# Patient Record
Sex: Male | Born: 1995 | Race: Black or African American | Hispanic: No | Marital: Single
Health system: Southern US, Community
[De-identification: ages and names within clinical notes are randomized; demographics above are authoritative.]

---
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
## 2015-11-14 ENCOUNTER — Ambulatory Visit (HOSPITAL_COMMUNITY)
Admission: EM | Admit: 2015-11-14 | Discharge: 2015-11-14 | Disposition: A | Discharge status: Home / Self Care | Payer: Medicaid Other | Attending: Family Medicine | Admitting: Family Medicine

## 2015-11-14 ENCOUNTER — Encounter (HOSPITAL_COMMUNITY): Payer: Self-pay | Admitting: Emergency Medicine

## 2015-11-14 DIAGNOSIS — N342 Other urethritis: Secondary | ICD-10-CM

## 2015-11-14 DIAGNOSIS — Z711 Person with feared health complaint in whom no diagnosis is made: Secondary | ICD-10-CM

## 2015-11-14 DIAGNOSIS — R300 Dysuria: Secondary | ICD-10-CM | POA: Diagnosis present

## 2015-11-14 MED ORDER — LIDOCAINE HCL (PF) 1 % IJ SOLN
INTRAMUSCULAR | Status: AC
  Filled 2015-11-14: qty 2

## 2015-11-14 MED ORDER — CEFTRIAXONE SODIUM 250 MG IJ SOLR
INTRAMUSCULAR | Status: AC
  Filled 2015-11-14: qty 250

## 2015-11-14 MED ORDER — AZITHROMYCIN 250 MG PO TABS
ORAL_TABLET | ORAL | Status: AC
  Filled 2015-11-14: qty 1

## 2015-11-14 MED ORDER — CEFTRIAXONE SODIUM 250 MG IJ SOLR
250.0000 mg | Freq: Once | INTRAMUSCULAR | Status: AC
  Administered 2015-11-14: 250 mg via INTRAMUSCULAR

## 2015-11-14 MED ORDER — AZITHROMYCIN 250 MG PO TABS
ORAL_TABLET | ORAL | Status: AC
  Filled 2015-11-14: qty 4

## 2015-11-14 MED ORDER — AZITHROMYCIN 250 MG PO TABS
1000.0000 mg | ORAL_TABLET | Freq: Once | ORAL | Status: AC
  Administered 2015-11-14: 1000 mg via ORAL

## 2015-11-14 NOTE — ED Provider Notes (Signed)
CSN: [id number]     Arrival date & time 11/14/15  1007 History   First MD Initiated Contact with Patient 11/14/15 1035     Chief Complaint  Patient presents with  . Dysuria   (Consider location/radiation/quality/duration/timing/severity/associated sxs/prior Treatment) HPI 20 year old male presents with dysuria and a white green discharge from his penis. He states that he was sexually active about a week and a half ago with a new partner. He states that he has a great deal of burning with urination. No other symptoms no previous history of STD. History reviewed. No pertinent past medical history. History reviewed. No pertinent surgical history. History reviewed. No pertinent family history. Social History  Substance Use Topics  . Smoking status: Never Smoker  . Smokeless tobacco: Never Used  . Alcohol use No    Review of Systems  Denies: HEADACHE, NAUSEA, ABDOMINAL PAIN, CHEST PAIN, CONGESTION, SHORTNESS OF BREATH  Allergies  Shellfish-derived products  Home Medications   Prior to Admission medications   Medication Sig Start Date End Date Taking? Authorizing Provider  cephALEXin (KEFLEX) 500 MG capsule Take 1 capsule (500 mg total) by mouth 2 (two) times daily. 11/10/12   Adlih Moreno-Coll, MD  hydrOXYzine (ATARAX/VISTARIL) 25 MG tablet Take 1 tablet (25 mg total) by mouth every 8 (eight) hours as needed for itching ((atarax)). 11/10/12   Adlih Moreno-Coll, MD  ketoconazole (NIZORAL) 2 % cream Apply topically 2 (two) times daily. 11/10/12   Sharin Grave, MD   Meds Ordered and Administered this Visit   Medications  cefTRIAXone (ROCEPHIN) injection 250 mg (250 mg Intramuscular Given 11/14/15 1107)  azithromycin (ZITHROMAX) tablet 1,000 mg (1,000 mg Oral Given 11/14/15 1104)    BP 133/79 (BP Location: Left Arm)   Pulse 63   Temp 98.2 F (36.8 C) (Oral)   Resp 16   SpO2 96%  No data found.   Physical Exam NURSES NOTES AND VITAL SIGNS REVIEWED. CONSTITUTIONAL: Well  developed, well nourished, no acute distress HEENT: normocephalic, atraumatic EYES: Conjunctiva normal NECK:normal ROM, supple, no adenopathy PULMONARY:No respiratory distress, normal effort ABDOMINAL: Soft, ND, NT BS+, No CVAT MUSCULOSKELETAL: Normal ROM of all extremities,  SKIN: warm and dry without rash PSYCHIATRIC: Mood and affect, behavior are normal  Urgent Care Course   Clinical Course    Procedures (including critical care time)  Labs Review Labs Reviewed  HIV ANTIBODY (ROUTINE TESTING)  URINE CYTOLOGY ANCILLARY ONLY   Cultures are pending along with HIV testing. Imaging Review No results found.   Visual Acuity Review  Right Eye Distance:   Left Eye Distance:   Bilateral Distance:    Right Eye Near:   Left Eye Near:    Bilateral Near:        Total Visit Time:20 MINUTES "GREATER THAN 50% WAS SPENT IN COUNSELING AND COORDINATION OF CARE WITH THE PATIENT" DISCUSSION OF SAFE SEX, STD TREATMENT WAS PROVIDED.   MDM   1. Urethritis   2. Concern about STD in male without diagnosis     Patient is reassured that there are no issues that require transfer to higher level of care at this time or additional tests. Patient is advised to continue home symptomatic treatment. Patient is advised that if there are new or worsening symptoms to attend the emergency department, contact primary care provider, or return to UC. Instructions of care provided discharged home in stable condition.    THIS NOTE WAS GENERATED USING A VOICE RECOGNITION SOFTWARE PROGRAM. ALL REASONABLE EFFORTS  WERE MADE TO PROOFREAD THIS DOCUMENT  FOR ACCURACY.  I have verbally reviewed the discharge instructions with the patient. A printed AVS was given to the patient.  All questions were answered prior to discharge.      Tharon Aquas, PA 11/14/15 2102

## 2015-11-14 NOTE — ED Triage Notes (Signed)
The patient presented to the [facility] with a complaint of dysuria and a penile discharge x 4 days.

## 2015-11-15 LAB — URINE CYTOLOGY ANCILLARY ONLY
Chlamydia: POSITIVE — AB
NEISSERIA GONORRHEA: POSITIVE — AB

## 2015-11-15 LAB — HIV ANTIBODY (ROUTINE TESTING W REFLEX): HIV Screen 4th Generation wRfx: NONREACTIVE

## 2015-11-20 ENCOUNTER — Telehealth (HOSPITAL_COMMUNITY): Payer: Self-pay | Admitting: *Deleted

## 2017-03-18 ENCOUNTER — Emergency Department (HOSPITAL_COMMUNITY)
Admission: EM | Admit: 2017-03-18 | Discharge: 2017-03-18 | Disposition: A | Discharge status: Home / Self Care | Payer: Medicaid Other | Attending: Emergency Medicine | Admitting: Emergency Medicine

## 2017-03-18 ENCOUNTER — Other Ambulatory Visit: Payer: Self-pay

## 2017-03-18 ENCOUNTER — Encounter (HOSPITAL_COMMUNITY): Payer: Self-pay | Admitting: Emergency Medicine

## 2017-03-18 DIAGNOSIS — Z202 Contact with and (suspected) exposure to infections with a predominantly sexual mode of transmission: Secondary | ICD-10-CM | POA: Diagnosis not present

## 2017-03-18 DIAGNOSIS — Z711 Person with feared health complaint in whom no diagnosis is made: Secondary | ICD-10-CM

## 2017-03-18 LAB — URINALYSIS, ROUTINE W REFLEX MICROSCOPIC
Bilirubin Urine: NEGATIVE
GLUCOSE, UA: NEGATIVE mg/dL
Hgb urine dipstick: NEGATIVE
Ketones, ur: NEGATIVE mg/dL
LEUKOCYTES UA: NEGATIVE
NITRITE: NEGATIVE
PROTEIN: NEGATIVE mg/dL
Specific Gravity, Urine: 1.018 (ref 1.005–1.030)
pH: 6 (ref 5.0–8.0)

## 2017-03-18 MED ORDER — AZITHROMYCIN 250 MG PO TABS
1000.0000 mg | ORAL_TABLET | Freq: Once | ORAL | Status: AC
  Administered 2017-03-18: 1000 mg via ORAL
  Filled 2017-03-18: qty 4

## 2017-03-18 MED ORDER — LIDOCAINE HCL (PF) 1 % IJ SOLN
INTRAMUSCULAR | Status: AC
  Administered 2017-03-18: 0.9 mL
  Filled 2017-03-18: qty 5

## 2017-03-18 MED ORDER — CEFTRIAXONE SODIUM 250 MG IJ SOLR
250.0000 mg | Freq: Once | INTRAMUSCULAR | Status: AC
  Administered 2017-03-18: 250 mg via INTRAMUSCULAR
  Filled 2017-03-18: qty 250

## 2017-03-18 NOTE — Discharge Instructions (Signed)
Follow-up with results of remaining lab work when available. Return to ED for worsening symptoms, penile discharge, rashes, lesions or penile tenderness.

## 2017-03-18 NOTE — ED Triage Notes (Signed)
Pt reports gf has chlamydia, wants to be checked for same, denies any symptoms.

## 2017-03-18 NOTE — ED Provider Notes (Signed)
[facility] Provider Note   CSN: [id number] Arrival date & time: 03/18/17  1155     History   Chief Complaint Chief Complaint  Patient presents with  . Exposure to STD    HPI David David Bradford is a 21 y.o. male who presents to ED for evaluation of exposure to STD.  He states that his girlfriend tested positive for chlamydia and he is here to get checked.  He does report mild dysuria but no penile discharge, rashes, lesions, abdominal pain, bowel changes.  He did have unprotected sexual intercourse with her yesterday.  HPI  History reviewed. No pertinent past medical history.  There are no active problems to display for this patient.   History reviewed. No pertinent surgical history.     Home Medications    Prior to Admission medications   Medication Sig Start Date End Date Taking? Authorizing Provider  cephALEXin (KEFLEX) 500 MG capsule Take 1 capsule (500 mg total) by mouth 2 (two) times daily. 11/10/12   Moreno-Coll, Adlih, MD  hydrOXYzine (ATARAX/VISTARIL) 25 MG tablet Take 1 tablet (25 mg total) by mouth every 8 (eight) hours as needed for itching ((atarax)). 11/10/12   Moreno-Coll, Adlih, MD  ketoconazole (NIZORAL) 2 % cream Apply topically 2 (two) times daily. 11/10/12   Moreno-Coll, Adlih, MD    Family History No family history on file.  Social History Social History   Tobacco Use  . Smoking status: Never Smoker  . Smokeless tobacco: Never Used  Substance Use Topics  . Alcohol use: No  . Drug use: No     Allergies   Shellfish-derived products   Review of Systems Review of Systems  Constitutional: Negative for chills and fever.  Gastrointestinal: Negative for abdominal pain, constipation, diarrhea, nausea and vomiting.  Genitourinary: Positive for dysuria. Negative for discharge, genital sores, penile pain, penile swelling, scrotal swelling and testicular pain.  Skin: Negative for rash.     Physical Exam Updated  Vital Signs BP (!) 147/70   Pulse (!) 56   Temp 98.8 F (37.1 C) (Oral)   Resp 12   SpO2 100%   Physical Exam  Constitutional: He appears well-developed and well-nourished. No distress.  HENT:  Head: Normocephalic and atraumatic.  Eyes: Conjunctivae and EOM are normal. No scleral icterus.  Neck: Normal range of motion.  Pulmonary/Chest: Effort normal. No respiratory distress.  Genitourinary: Right testis shows no tenderness. Left testis shows no tenderness. Circumcised. No penile tenderness. No discharge found.  Genitourinary Comments: Normal male genitalia noted. Penis, scrotum, and testicles without swelling, lesions, rashes, or tenderness present. No penile discharge noted. Cremasteric reflex intact. RN served as Biomedical engineerchaperone during the exam.   Neurological: He is alert.  Skin: No rash noted. He is not diaphoretic.  Psychiatric: He has a normal mood and affect.  Nursing note and vitals reviewed.    ED Treatments / Results  Labs (all labs ordered are listed, but only abnormal results are displayed) Labs Reviewed  URINALYSIS, ROUTINE W REFLEX MICROSCOPIC  HIV ANTIBODY (ROUTINE TESTING)  RPR  GC/CHLAMYDIA PROBE AMP ([place]    EKG  EKG Interpretation None       Radiology No results found.  Procedures Procedures (including critical care time)  Medications Ordered in ED Medications  cefTRIAXone (ROCEPHIN) injection 250 mg (250 mg Intramuscular Given 03/18/17 1435)  azithromycin (ZITHROMAX) tablet 1,000 mg (1,000 mg Oral Given 03/18/17 1435)  lidocaine (PF) (XYLOCAINE) 1 % injection (0.9 mLs  Given  03/18/17 1436)     Initial Impression / Assessment and Plan / ED Course  I have reviewed the triage vital signs and the nursing notes.  Pertinent labs & imaging results that were available during my care of the patient were reviewed by me and considered in my medical decision making (see chart for details).     Patient presents to ED for STD exposure.   States that his girlfriend was diagnosed with chlamydia recently.  Last unprotected sexual intercourse was last night.  No rashes, lesions or bumps noted on physical exam.  He is afebrile with no abdominal pain, urinary symptoms or bowel changes.  He is nontoxic-appearing and in no acute distress.  We will treat empirically for gonorrhea and chlamydia and advised patient to follow-up with the results of remaining lab work when available.  Patient appears stable for discharge at this time.  Strict return precautions given.  Final Clinical Impressions(s) / ED Diagnoses   Final diagnoses:  Concern about STD in male without diagnosis    ED Discharge Orders    None       David PatesKhatri, David David Bradford, David Bradford 03/18/17 1441    David David Bradford, [name], David David Bradford 03/19/17 1603

## 2017-03-19 LAB — HIV ANTIBODY (ROUTINE TESTING W REFLEX): HIV SCREEN 4TH GENERATION: NONREACTIVE

## 2017-03-19 LAB — RPR: RPR Ser Ql: NONREACTIVE

## 2017-03-19 LAB — GC/CHLAMYDIA PROBE AMP (~~LOC~~) NOT AT ARMC
Chlamydia: POSITIVE — AB
NEISSERIA GONORRHEA: NEGATIVE

## 2021-05-20 ENCOUNTER — Other Ambulatory Visit: Payer: Self-pay

## 2021-05-20 ENCOUNTER — Ambulatory Visit (HOSPITAL_COMMUNITY)
Admission: EM | Admit: 2021-05-20 | Discharge: 2021-05-20 | Disposition: A | Discharge status: Home / Self Care | Payer: Self-pay | Attending: Urgent Care | Admitting: Urgent Care

## 2021-05-20 ENCOUNTER — Encounter (HOSPITAL_COMMUNITY): Payer: Self-pay

## 2021-05-20 DIAGNOSIS — Z7251 High risk heterosexual behavior: Secondary | ICD-10-CM | POA: Insufficient documentation

## 2021-05-20 DIAGNOSIS — R369 Urethral discharge, unspecified: Secondary | ICD-10-CM | POA: Insufficient documentation

## 2021-05-20 DIAGNOSIS — R300 Dysuria: Secondary | ICD-10-CM | POA: Insufficient documentation

## 2021-05-20 MED ORDER — CEFTRIAXONE SODIUM 1 G IJ SOLR: 1.0000 g | Freq: Once | INTRAMUSCULAR | Status: DC

## 2021-05-20 MED ORDER — CEFTRIAXONE SODIUM 500 MG IJ SOLR
500.0000 mg | Freq: Once | INTRAMUSCULAR | Status: AC
  Administered 2021-05-20: 500 mg via INTRAMUSCULAR

## 2021-05-20 MED ORDER — CEFTRIAXONE SODIUM 500 MG IJ SOLR
INTRAMUSCULAR | Status: AC
  Filled 2021-05-20: qty 500

## 2021-05-20 MED ORDER — LIDOCAINE HCL (PF) 1 % IJ SOLN
INTRAMUSCULAR | Status: AC
  Filled 2021-05-20: qty 2

## 2021-05-20 NOTE — ED Triage Notes (Signed)
Pt presents with c/o dysuria X 4 days.   Pt reports for STD testing.

## 2021-05-20 NOTE — ED Provider Notes (Signed)
[facility]   MRN: [id number] DOB: 1995-09-30  Subjective:   David Bradford is a 26 y.o. male presenting for 4 day history of acute onset dysuria. He is sexually active, generally uses condoms but recently in the past week he had 75 male partner that he chose not to use condoms with.  He is unaware of any particular infections that she may have.  Denies hematuria, urinary frequency, penile discharge, penile swelling, testicular pain, testicular swelling, anal pain, groin pain.  No concern for oral or anal infections.    No current facility-administered medications for this encounter.  Current Outpatient Medications:    cephALEXin (KEFLEX) 500 MG capsule, Take 1 capsule (500 mg total) by mouth 2 (two) times daily., Disp: 14 capsule, Rfl: 0   hydrOXYzine (ATARAX/VISTARIL) 25 MG tablet, Take 1 tablet (25 mg total) by mouth every 8 (eight) hours as needed for itching ((atarax))., Disp: 21 tablet, Rfl: 0   ketoconazole (NIZORAL) 2 % cream, Apply topically 2 (two) times daily., Disp: 60 g, Rfl: 0   Allergies  Allergen Reactions   Shellfish-Derived Products Anaphylaxis    History reviewed. No pertinent past medical history.   History reviewed. No pertinent surgical history.  History reviewed. No pertinent family history.  Social History   Tobacco Use   Smoking status: Every Day    Packs/day: 1.00    Types: Cigarettes   Smokeless tobacco: Never  Vaping Use   Vaping Use: Never used  Substance Use Topics   Alcohol use: Yes    Comment: everyday   Drug use: Yes    Frequency: 2.0 times per week    Types: Marijuana    ROS   Objective:   Vitals: BP 127/80    Pulse 70    Temp 98.3 F (36.8 C) (Oral)    Resp 17    SpO2 99%   Physical Exam Constitutional:      General: He is not in acute distress.    Appearance: Normal appearance. He is well-developed and normal weight. He is not ill-appearing, toxic-appearing or diaphoretic.  HENT:     Head: Normocephalic  and atraumatic.     Right Ear: External ear normal.     Left Ear: External ear normal.     Nose: Nose normal.     Mouth/Throat:     Pharynx: Oropharynx is clear.  Eyes:     General: No scleral icterus.       Right eye: No discharge.        Left eye: No discharge.     Extraocular Movements: Extraocular movements intact.  Cardiovascular:     Rate and Rhythm: Normal rate.  Pulmonary:     Effort: Pulmonary effort is normal.  Genitourinary:    Penis: Discharge (white, thick) present. No phimosis, paraphimosis, hypospadias, erythema, tenderness, swelling or lesions.   Musculoskeletal:     Cervical back: Normal range of motion.  Neurological:     Mental Status: He is alert and oriented to person, place, and time.  Psychiatric:        Mood and Affect: Mood normal.        Behavior: Behavior normal.        Thought Content: Thought content normal.        Judgment: Judgment normal.    Assessment and Plan :   PDMP not reviewed this encounter.  1. Penile discharge   2. Dysuria   3. Unprotected sex     Patient has frank  penile discharge on exam and therefore we will treat empirically with IM ceftriaxone.  This is in line with CDC guidelines.  However, patient declined a prescription for doxycycline due to financial burden.  We will treat based off of results.  Patient refused HIV and syphilis testing. Counseled patient on potential for adverse effects with medications prescribed/recommended today, ER and return-to-clinic precautions discussed, patient verbalized understanding.    Jaynee Eagles, [place] 05/20/21 1628

## 2021-05-20 NOTE — Discharge Instructions (Addendum)
Avoid all forms of sexual intercourse (oral, vaginal, anal) for the next 7 days to avoid spreading/reinfecting or at least until we can see what kinds of infection results are positive. Return if symptoms worsen/do not resolve, you develop fever, abdominal pain, blood in your urine, or are re-exposed to a sexually transmitted infection (STI).  

## 2021-05-21 LAB — CYTOLOGY, (ORAL, ANAL, URETHRAL) ANCILLARY ONLY
Chlamydia: NEGATIVE
Comment: NEGATIVE
Comment: NEGATIVE
Comment: NORMAL
Neisseria Gonorrhea: POSITIVE — AB
Trichomonas: POSITIVE — AB

## 2021-05-22 ENCOUNTER — Telehealth (HOSPITAL_COMMUNITY): Payer: Self-pay | Admitting: Emergency Medicine

## 2021-05-22 MED ORDER — METRONIDAZOLE 500 MG PO TABS: 2000.0000 mg | ORAL_TABLET | Freq: Once | ORAL | 0 refills | Status: AC

## 2022-01-04 ENCOUNTER — Other Ambulatory Visit: Payer: Self-pay

## 2022-01-04 ENCOUNTER — Encounter (HOSPITAL_COMMUNITY): Payer: Self-pay | Admitting: *Deleted

## 2022-01-04 ENCOUNTER — Ambulatory Visit (HOSPITAL_COMMUNITY)
Admission: EM | Admit: 2022-01-04 | Discharge: 2022-01-04 | Disposition: A | Discharge status: Home / Self Care | Payer: Medicaid Other | Attending: Family Medicine | Admitting: Family Medicine

## 2022-01-04 DIAGNOSIS — R369 Urethral discharge, unspecified: Secondary | ICD-10-CM

## 2022-01-04 DIAGNOSIS — Z202 Contact with and (suspected) exposure to infections with a predominantly sexual mode of transmission: Secondary | ICD-10-CM

## 2022-01-04 MED ORDER — METRONIDAZOLE 500 MG PO TABS
2000.0000 mg | ORAL_TABLET | Freq: Once | ORAL | Status: AC
  Administered 2022-01-04: 2000 mg via ORAL

## 2022-01-04 MED ORDER — DOXYCYCLINE HYCLATE 100 MG PO CAPS: 100.0000 mg | ORAL_CAPSULE | Freq: Two times a day (BID) | ORAL | 0 refills | Status: DC

## 2022-01-04 MED ORDER — CEFTRIAXONE SODIUM 500 MG IJ SOLR
INTRAMUSCULAR | Status: AC
  Filled 2022-01-04: qty 500

## 2022-01-04 MED ORDER — METRONIDAZOLE 500 MG PO TABS
ORAL_TABLET | ORAL | Status: AC
  Filled 2022-01-04: qty 4

## 2022-01-04 MED ORDER — CEFTRIAXONE SODIUM 500 MG IJ SOLR
500.0000 mg | Freq: Once | INTRAMUSCULAR | Status: AC
  Administered 2022-01-04: 500 mg via INTRAMUSCULAR

## 2022-01-04 NOTE — ED Triage Notes (Signed)
Pt reports he was notified by other partner they tested Positive for STD. Sx;s dysura ,and discharge from penis.

## 2022-01-04 NOTE — Discharge Instructions (Signed)
You have been given the following today for treatment of suspected gonorrhea and/or chlamydia and/or trich:  cefTRIAXone (ROCEPHIN) injection 500 mg Metronidazole 2000mg  by mouth  Please pick up your prescription for doxycycline 100 mg and begin taking twice daily for the next seven (7) days.  Even though we have treated you today, we have sent testing for sexually transmitted infections. We will notify you of any positive results once they are received. If required, we will prescribe any medications you might need.  Please refrain from all sexual activity for at least the next seven days.
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
## 2022-01-11 ENCOUNTER — Encounter (HOSPITAL_COMMUNITY): Admission: EM | Disposition: A | Discharge status: Home / Self Care | Payer: Self-pay | Source: Home / Self Care

## 2022-01-11 ENCOUNTER — Emergency Department (HOSPITAL_COMMUNITY): Payer: Self-pay | Admitting: Anesthesiology

## 2022-01-11 ENCOUNTER — Inpatient Hospital Stay (HOSPITAL_COMMUNITY)
Admission: EM | Admit: 2022-01-11 | Discharge: 2022-01-17 | DRG: 328 | Disposition: A | Discharge status: Home / Self Care | Payer: Self-pay | Attending: General Surgery | Admitting: General Surgery

## 2022-01-11 ENCOUNTER — Emergency Department (HOSPITAL_COMMUNITY): Payer: Self-pay

## 2022-01-11 ENCOUNTER — Encounter (HOSPITAL_COMMUNITY): Payer: Self-pay | Admitting: Emergency Medicine

## 2022-01-11 DIAGNOSIS — W3400XA Accidental discharge from unspecified firearms or gun, initial encounter: Principal | ICD-10-CM

## 2022-01-11 DIAGNOSIS — Z9889 Other specified postprocedural states: Secondary | ICD-10-CM

## 2022-01-11 DIAGNOSIS — Z91013 Allergy to seafood: Secondary | ICD-10-CM

## 2022-01-11 DIAGNOSIS — S36521A Contusion of transverse colon, initial encounter: Secondary | ICD-10-CM | POA: Diagnosis present

## 2022-01-11 DIAGNOSIS — E876 Hypokalemia: Secondary | ICD-10-CM | POA: Diagnosis present

## 2022-01-11 DIAGNOSIS — S3632XA Contusion of stomach, initial encounter: Secondary | ICD-10-CM | POA: Diagnosis present

## 2022-01-11 DIAGNOSIS — Z5331 Laparoscopic surgical procedure converted to open procedure: Secondary | ICD-10-CM

## 2022-01-11 DIAGNOSIS — Z23 Encounter for immunization: Secondary | ICD-10-CM

## 2022-01-11 DIAGNOSIS — S31631A Puncture wound without foreign body of abdominal wall, left upper quadrant with penetration into peritoneal cavity, initial encounter: Principal | ICD-10-CM | POA: Diagnosis present

## 2022-01-11 DIAGNOSIS — S31139A Puncture wound of abdominal wall without foreign body, unspecified quadrant without penetration into peritoneal cavity, initial encounter: Secondary | ICD-10-CM

## 2022-01-11 DIAGNOSIS — Z1152 Encounter for screening for COVID-19: Secondary | ICD-10-CM

## 2022-01-11 HISTORY — PX: LAPAROSCOPY: SHX197

## 2022-01-11 HISTORY — PX: LAPAROTOMY: SHX154

## 2022-01-11 HISTORY — PX: GASTRORRHAPHY: SHX6263

## 2022-01-11 LAB — COMPREHENSIVE METABOLIC PANEL
ALT: 19 U/L (ref 0–44)
AST: 29 U/L (ref 15–41)
Albumin: 4.4 g/dL (ref 3.5–5.0)
Alkaline Phosphatase: 62 U/L (ref 38–126)
Anion gap: 20 — ABNORMAL HIGH (ref 5–15)
BUN: 10 mg/dL (ref 6–20)
CO2: 15 mmol/L — ABNORMAL LOW (ref 22–32)
Calcium: 9.2 mg/dL (ref 8.9–10.3)
Chloride: 104 mmol/L (ref 98–111)
Creatinine, Ser: 1.43 mg/dL — ABNORMAL HIGH (ref 0.61–1.24)
GFR, Estimated: >60 mL/min (ref >60)
Glucose, Bld: 110 mg/dL — ABNORMAL HIGH (ref 70–99)
Potassium: 3.3 mmol/L — ABNORMAL LOW (ref 3.5–5.1)
Sodium: 139 mmol/L (ref 135–145)
Total Bilirubin: 0.5 mg/dL (ref 0.3–1.2)
Total Protein: 7.3 g/dL (ref 6.5–8.1)

## 2022-01-11 LAB — SAMPLE TO BLOOD BANK

## 2022-01-11 LAB — CBC
HCT: 41.2 % (ref 39.0–52.0)
Hemoglobin: 13.6 g/dL (ref 13.0–17.0)
MCH: 31.2 pg (ref 26.0–34.0)
MCHC: 33 g/dL (ref 30.0–36.0)
MCV: 94.5 fL (ref 80.0–100.0)
Platelets: 254 10*3/uL (ref 150–400)
RBC: 4.36 MIL/uL (ref 4.22–5.81)
RDW: 12.1 % (ref 11.5–15.5)
WBC: 4.9 10*3/uL (ref 4.0–10.5)
nRBC: 0 % (ref 0.0–0.2)

## 2022-01-11 LAB — I-STAT CHEM 8, ED
BUN: 9 mg/dL (ref 6–20)
Calcium, Ion: 1.02 mmol/L — ABNORMAL LOW (ref 1.15–1.40)
Chloride: 105 mmol/L (ref 98–111)
Creatinine, Ser: 1.5 mg/dL — ABNORMAL HIGH (ref 0.61–1.24)
Glucose, Bld: 106 mg/dL — ABNORMAL HIGH (ref 70–99)
HCT: 43 % (ref 39.0–52.0)
Hemoglobin: 14.6 g/dL (ref 13.0–17.0)
Potassium: 3.3 mmol/L — ABNORMAL LOW (ref 3.5–5.1)
Sodium: 140 mmol/L (ref 135–145)
TCO2: 15 mmol/L — ABNORMAL LOW (ref 22–32)

## 2022-01-11 LAB — ETHANOL: Alcohol, Ethyl (B): 140 mg/dL — ABNORMAL HIGH (ref <10)

## 2022-01-11 LAB — PROTIME-INR
INR: 1.1 (ref 0.8–1.2)
Prothrombin Time: 14.4 seconds (ref 11.4–15.2)

## 2022-01-11 LAB — LACTIC ACID, PLASMA: Lactic Acid, Venous: >9 mmol/L (ref 0.5–1.9)

## 2022-01-11 LAB — RESP PANEL BY RT-PCR (FLU A&B, COVID) ARPGX2
Influenza A by PCR: NEGATIVE
Influenza B by PCR: NEGATIVE
SARS Coronavirus 2 by RT PCR: NEGATIVE

## 2022-01-11 SURGERY — LAPAROTOMY, EXPLORATORY
Anesthesia: General | Site: Abdomen

## 2022-01-11 MED ORDER — SUGAMMADEX SODIUM 200 MG/2ML IV SOLN
INTRAVENOUS | Status: DC | PRN
  Administered 2022-01-11 (×3): 100 mg via INTRAVENOUS

## 2022-01-11 MED ORDER — HYDROMORPHONE HCL 1 MG/ML IJ SOLN
INTRAMUSCULAR | Status: AC
  Filled 2022-01-11: qty 1

## 2022-01-11 MED ORDER — FENTANYL CITRATE (PF) 250 MCG/5ML IJ SOLN
INTRAMUSCULAR | Status: AC
  Filled 2022-01-11: qty 5

## 2022-01-11 MED ORDER — PROPOFOL 1000 MG/100ML IV EMUL
INTRAVENOUS | Status: AC
  Filled 2022-01-11: qty 100

## 2022-01-11 MED ORDER — BUPIVACAINE HCL 0.25 % IJ SOLN
INTRAMUSCULAR | Status: DC | PRN
  Administered 2022-01-11: 5 mL

## 2022-01-11 MED ORDER — ROCURONIUM BROMIDE 10 MG/ML (PF) SYRINGE
PREFILLED_SYRINGE | INTRAVENOUS | Status: AC
  Filled 2022-01-11: qty 10

## 2022-01-11 MED ORDER — FENTANYL CITRATE PF 50 MCG/ML IJ SOSY
PREFILLED_SYRINGE | INTRAMUSCULAR | Status: AC
  Administered 2022-01-11: 50 ug
  Filled 2022-01-11: qty 1

## 2022-01-11 MED ORDER — BUPIVACAINE-EPINEPHRINE (PF) 0.25% -1:200000 IJ SOLN
INTRAMUSCULAR | Status: AC
  Filled 2022-01-11: qty 30

## 2022-01-11 MED ORDER — SUCCINYLCHOLINE CHLORIDE 200 MG/10ML IV SOSY
PREFILLED_SYRINGE | INTRAVENOUS | Status: DC | PRN
  Administered 2022-01-11: 120 mg via INTRAVENOUS

## 2022-01-11 MED ORDER — METRONIDAZOLE 500 MG/100ML IV SOLN
500.0000 mg | INTRAVENOUS | Status: AC
  Administered 2022-01-11: 500 mg via INTRAVENOUS
  Filled 2022-01-11: qty 100

## 2022-01-11 MED ORDER — ONDANSETRON HCL 4 MG/2ML IJ SOLN
INTRAMUSCULAR | Status: AC
  Filled 2022-01-11: qty 2

## 2022-01-11 MED ORDER — LACTATED RINGERS IV SOLN: INTRAVENOUS | Status: DC | PRN

## 2022-01-11 MED ORDER — PHENYLEPHRINE 80 MCG/ML (10ML) SYRINGE FOR IV PUSH (FOR BLOOD PRESSURE SUPPORT)
PREFILLED_SYRINGE | INTRAVENOUS | Status: AC
  Filled 2022-01-11: qty 10

## 2022-01-11 MED ORDER — LIDOCAINE 2% (20 MG/ML) 5 ML SYRINGE
INTRAMUSCULAR | Status: DC | PRN
  Administered 2022-01-11: 40 mg via INTRAVENOUS

## 2022-01-11 MED ORDER — EPHEDRINE 5 MG/ML INJ
INTRAVENOUS | Status: AC
  Filled 2022-01-11: qty 5

## 2022-01-11 MED ORDER — LIDOCAINE 2% (20 MG/ML) 5 ML SYRINGE
INTRAMUSCULAR | Status: AC
  Filled 2022-01-11: qty 5

## 2022-01-11 MED ORDER — ALBUMIN HUMAN 5 % IV SOLN: INTRAVENOUS | Status: DC | PRN

## 2022-01-11 MED ORDER — DEXAMETHASONE SODIUM PHOSPHATE 10 MG/ML IJ SOLN
INTRAMUSCULAR | Status: AC
  Filled 2022-01-11: qty 1

## 2022-01-11 MED ORDER — CEFAZOLIN SODIUM-DEXTROSE 2-4 GM/100ML-% IV SOLN
2.0000 g | Freq: Once | INTRAVENOUS | Status: AC
  Administered 2022-01-11: 2 g via INTRAVENOUS
  Filled 2022-01-11: qty 100

## 2022-01-11 MED ORDER — ROCURONIUM BROMIDE 10 MG/ML (PF) SYRINGE
PREFILLED_SYRINGE | INTRAVENOUS | Status: DC | PRN
  Administered 2022-01-11: 50 mg via INTRAVENOUS

## 2022-01-11 MED ORDER — TETANUS-DIPHTH-ACELL PERTUSSIS 5-2.5-18.5 LF-MCG/0.5 IM SUSY
0.5000 mL | PREFILLED_SYRINGE | Freq: Once | INTRAMUSCULAR | Status: AC
  Administered 2022-01-11: 0.5 mL via INTRAMUSCULAR

## 2022-01-11 MED ORDER — PHENYLEPHRINE 80 MCG/ML (10ML) SYRINGE FOR IV PUSH (FOR BLOOD PRESSURE SUPPORT)
PREFILLED_SYRINGE | INTRAVENOUS | Status: DC | PRN
  Administered 2022-01-11 (×2): 160 ug via INTRAVENOUS

## 2022-01-11 MED ORDER — OXYCODONE HCL 5 MG PO TABS: 5.0000 mg | ORAL_TABLET | Freq: Once | ORAL | Status: DC | PRN

## 2022-01-11 MED ORDER — ONDANSETRON HCL 4 MG/2ML IJ SOLN
INTRAMUSCULAR | Status: DC | PRN
  Administered 2022-01-11: 4 mg via INTRAVENOUS

## 2022-01-11 MED ORDER — SUCCINYLCHOLINE CHLORIDE 200 MG/10ML IV SOSY
PREFILLED_SYRINGE | INTRAVENOUS | Status: AC
  Filled 2022-01-11: qty 10

## 2022-01-11 MED ORDER — EPHEDRINE SULFATE-NACL 50-0.9 MG/10ML-% IV SOSY
PREFILLED_SYRINGE | INTRAVENOUS | Status: DC | PRN
  Administered 2022-01-11 (×2): 10 mg via INTRAVENOUS

## 2022-01-11 MED ORDER — FENTANYL CITRATE (PF) 250 MCG/5ML IJ SOLN
INTRAMUSCULAR | Status: DC | PRN
  Administered 2022-01-11: 100 ug via INTRAVENOUS
  Administered 2022-01-11: 50 ug via INTRAVENOUS

## 2022-01-11 MED ORDER — SODIUM CHLORIDE 0.9 % IV SOLN: INTRAVENOUS | Status: DC | PRN

## 2022-01-11 MED ORDER — ONDANSETRON HCL 4 MG/2ML IJ SOLN: 4.0000 mg | Freq: Four times a day (QID) | INTRAMUSCULAR | Status: DC | PRN

## 2022-01-11 MED ORDER — 0.9 % SODIUM CHLORIDE (POUR BTL) OPTIME
TOPICAL | Status: DC | PRN
  Administered 2022-01-11: 1000 mL

## 2022-01-11 MED ORDER — FENTANYL CITRATE PF 50 MCG/ML IJ SOSY: 50.0000 ug | PREFILLED_SYRINGE | Freq: Once | INTRAMUSCULAR | Status: AC

## 2022-01-11 MED ORDER — MIDAZOLAM HCL 2 MG/2ML IJ SOLN
INTRAMUSCULAR | Status: AC
  Filled 2022-01-11: qty 2

## 2022-01-11 MED ORDER — IOHEXOL 350 MG/ML SOLN
100.0000 mL | Freq: Once | INTRAVENOUS | Status: AC | PRN
  Administered 2022-01-11: 100 mL via INTRAVENOUS

## 2022-01-11 MED ORDER — MIDAZOLAM HCL 2 MG/2ML IJ SOLN
INTRAMUSCULAR | Status: DC | PRN
  Administered 2022-01-11: 2 mg via INTRAVENOUS

## 2022-01-11 MED ORDER — HYDROMORPHONE HCL 1 MG/ML IJ SOLN
0.2500 mg | INTRAMUSCULAR | Status: DC | PRN
  Administered 2022-01-11 – 2022-01-12 (×4): 0.5 mg via INTRAVENOUS

## 2022-01-11 MED ORDER — DEXAMETHASONE SODIUM PHOSPHATE 10 MG/ML IJ SOLN
INTRAMUSCULAR | Status: DC | PRN
  Administered 2022-01-11: 5 mg via INTRAVENOUS

## 2022-01-11 MED ORDER — OXYCODONE HCL 5 MG/5ML PO SOLN: 5.0000 mg | Freq: Once | ORAL | Status: DC | PRN

## 2022-01-11 MED ORDER — PROPOFOL 10 MG/ML IV BOLUS
INTRAVENOUS | Status: AC
  Filled 2022-01-11: qty 20

## 2022-01-11 MED ORDER — PROPOFOL 10 MG/ML IV BOLUS
INTRAVENOUS | Status: DC | PRN
  Administered 2022-01-11: 200 mg via INTRAVENOUS

## 2022-01-11 SURGICAL SUPPLY — 63 items
ADH SKN CLS APL DERMABOND .7 (GAUZE/BANDAGES/DRESSINGS) ×1
APL PRP STRL LF DISP 70% ISPRP (MISCELLANEOUS) ×1
BAG COUNTER SPONGE SURGICOUNT (BAG) ×2 IMPLANT
BAG SPNG CNTER NS LX DISP (BAG) ×1
BLADE CLIPPER SURG (BLADE) IMPLANT
CANISTER SUCT 3000ML PPV (MISCELLANEOUS) IMPLANT
CHLORAPREP W/TINT 26 (MISCELLANEOUS) ×2 IMPLANT
COVER SURGICAL LIGHT HANDLE (MISCELLANEOUS) ×2 IMPLANT
DERMABOND ADVANCED .7 DNX12 (GAUZE/BANDAGES/DRESSINGS) ×2 IMPLANT
DRAPE WARM FLUID 44X44 (DRAPES) ×2 IMPLANT
DRSG TEGADERM 4X10 (GAUZE/BANDAGES/DRESSINGS) IMPLANT
DRSG TEGADERM 4X4.5 CHG (GAUZE/BANDAGES/DRESSINGS) IMPLANT
ELECT REM PT RETURN 9FT ADLT (ELECTROSURGICAL) ×1
ELECTRODE REM PT RTRN 9FT ADLT (ELECTROSURGICAL) ×2 IMPLANT
GAUZE SPONGE 4X4 12PLY STRL (GAUZE/BANDAGES/DRESSINGS) IMPLANT
GLOVE BIO SURGEON STRL SZ7.5 (GLOVE) ×2 IMPLANT
GLOVE BIOGEL M STER SZ 6 (GLOVE) IMPLANT
GLOVE BIOGEL PI IND STRL 7.0 (GLOVE) IMPLANT
GLOVE BIOGEL PI IND STRL 7.5 (GLOVE) IMPLANT
GLOVE BIOGEL PI IND STRL 8 (GLOVE) IMPLANT
GLOVE SURG SYN 7.5  E (GLOVE) ×1
GLOVE SURG SYN 7.5 E (GLOVE) ×1 IMPLANT
GLOVE SURG SYN 7.5 PF PI (GLOVE) ×2 IMPLANT
GOWN STRL REUS W/ TWL LRG LVL3 (GOWN DISPOSABLE) ×4 IMPLANT
GOWN STRL REUS W/ TWL XL LVL3 (GOWN DISPOSABLE) ×2 IMPLANT
GOWN STRL REUS W/TWL LRG LVL3 (GOWN DISPOSABLE) ×2
GOWN STRL REUS W/TWL XL LVL3 (GOWN DISPOSABLE) ×1
KIT BASIN OR (CUSTOM PROCEDURE TRAY) ×2 IMPLANT
KIT TURNOVER KIT B (KITS) ×2 IMPLANT
MASK SURG LG TIE L3 ANTIFOG (MASK) IMPLANT
NDL HYPO 25GX1X1/2 BEV (NEEDLE) IMPLANT
NDL INSUFFLATION 14GA 120MM (NEEDLE) ×2 IMPLANT
NEEDLE HYPO 25GX1X1/2 BEV (NEEDLE) ×1 IMPLANT
NEEDLE INSUFFLATION 14GA 120MM (NEEDLE) ×2 IMPLANT
NS IRRIG 1000ML POUR BTL (IV SOLUTION) ×2 IMPLANT
PAD ARMBOARD 7.5X6 YLW CONV (MISCELLANEOUS) ×4 IMPLANT
RELOAD PROXIMATE 75MM BLUE (ENDOMECHANICALS) ×1 IMPLANT
RELOAD STAPLE 75 3.8 BLU REG (ENDOMECHANICALS) IMPLANT
SCISSORS LAP 5X35 DISP (ENDOMECHANICALS) IMPLANT
SET IRRIG TUBING LAPAROSCOPIC (IRRIGATION / IRRIGATOR) IMPLANT
SET TUBE SMOKE EVAC HIGH FLOW (TUBING) ×2 IMPLANT
SLEEVE ENDOPATH XCEL 5M (ENDOMECHANICALS) ×2 IMPLANT
STAPLER PROXIMATE 75MM BLUE (STAPLE) IMPLANT
STAPLER VISISTAT 35W (STAPLE) IMPLANT
SUT MNCRL AB 4-0 PS2 18 (SUTURE) ×2 IMPLANT
SUT PDS AB 1 CT  36 (SUTURE) ×2
SUT PDS AB 1 CT 36 (SUTURE) IMPLANT
SUT SILK 2 0 SH CR/8 (SUTURE) IMPLANT
SUT SILK 2 0 TIES 10X30 (SUTURE) IMPLANT
SUT SILK 3 0 SH CR/8 (SUTURE) IMPLANT
SUT SILK 3 0 TIES 10X30 (SUTURE) IMPLANT
SYR CONTROL 10ML LL (SYRINGE) IMPLANT
TAPE CLOTH SURG 4X10 WHT LF (GAUZE/BANDAGES/DRESSINGS) IMPLANT
TOWEL GREEN STERILE (TOWEL DISPOSABLE) ×2 IMPLANT
TOWEL GREEN STERILE FF (TOWEL DISPOSABLE) ×2 IMPLANT
TRAY LAPAROSCOPIC MC (CUSTOM PROCEDURE TRAY) ×2 IMPLANT
TROCAR BALLN 12MMX100 BLUNT (TROCAR) IMPLANT
TROCAR XCEL 12X100 BLDLESS (ENDOMECHANICALS) IMPLANT
TROCAR XCEL BLUNT TIP 100MML (ENDOMECHANICALS) IMPLANT
TROCAR XCEL NON-BLD 11X100MML (ENDOMECHANICALS) IMPLANT
TROCAR XCEL NON-BLD 5MMX100MML (ENDOMECHANICALS) IMPLANT
TROCAR Z-THREAD OPTICAL 5X100M (TROCAR) ×2 IMPLANT
WARMER LAPAROSCOPE (MISCELLANEOUS) ×2 IMPLANT

## 2022-01-11 NOTE — Progress Notes (Signed)
Orthopedic Tech Progress Note Patient Details:  Shaheer Bonfield Nov 18, 1995 [id number]  Patient ID: Evaristo Bury, male   DOB: 1995-11-26, 26 y.o.   MRN: [id number] I attended trauma page Karolee Stamps 01/11/2022, 9:39 PM
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
## 2022-01-11 NOTE — Transfer of Care (Signed)
Immediate Anesthesia Transfer of Care Note  Patient: David Bradford  Procedure(s) Performed: LAPAROSCOPY DIAGNOSTIC, POSSIBLE EXPLOR LAP (Abdomen) CONVERTED TO OPEN EXPLORATORY LAPAROTOMY (Abdomen) GASTRORRHAPHY (Abdomen)  Patient Location: PACU  Anesthesia Type:General  Level of Consciousness: drowsy  Airway & Oxygen Therapy: Patient Spontanous Breathing and Patient connected to face mask oxygen  Post-op Assessment: Report given to RN and Post -op Vital signs reviewed and stable  Post vital signs: Reviewed and stable  Last Vitals:  Vitals Value Taken Time  BP 145/88 01/11/22 2315  Temp    Pulse 94 01/11/22 2315  Resp 14 01/11/22 2315  SpO2 96 % 01/11/22 2315  Vitals shown include unvalidated device data.  Last Pain:  Vitals:   01/11/22 2108  TempSrc:   PainSc: 8          Complications: No notable events documented.

## 2022-01-11 NOTE — ED Notes (Signed)
Patient transported to CT scan . 
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
## 2022-01-11 NOTE — Anesthesia Procedure Notes (Signed)
Procedure Name: Intubation Date/Time: 01/11/2022 9:43 PM  Performed by: Trinna Post., CRNAPre-anesthesia Checklist: Patient identified, Emergency Drugs available, Suction available, Patient being monitored and Timeout performed Patient Re-evaluated:Patient Re-evaluated prior to induction Oxygen Delivery Method: Circle system utilized Preoxygenation: Pre-oxygenation with 100% oxygen Induction Type: IV induction, Rapid sequence and Cricoid Pressure applied Laryngoscope Size: Mac and 4 Grade View: Grade I Tube type: Oral Tube size: 7.5 mm Number of attempts: 1 Airway Equipment and Method: Stylet Placement Confirmation: ETT inserted through vocal cords under direct vision, positive ETCO2 and breath sounds checked- equal and bilateral Secured at: 21 cm Tube secured with: Tape Dental Injury: Teeth and Oropharynx as per pre-operative assessment
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
## 2022-01-12 ENCOUNTER — Other Ambulatory Visit: Payer: Self-pay

## 2022-01-12 LAB — CBC
HCT: 38.1 % — ABNORMAL LOW (ref 39.0–52.0)
Hemoglobin: 12.7 g/dL — ABNORMAL LOW (ref 13.0–17.0)
MCH: 30.9 pg (ref 26.0–34.0)
MCHC: 33.3 g/dL (ref 30.0–36.0)
MCV: 92.7 fL (ref 80.0–100.0)
Platelets: 225 10*3/uL (ref 150–400)
RBC: 4.11 MIL/uL — ABNORMAL LOW (ref 4.22–5.81)
RDW: 12.2 % (ref 11.5–15.5)
WBC: 12.1 10*3/uL — ABNORMAL HIGH (ref 4.0–10.5)
nRBC: 0 % (ref 0.0–0.2)

## 2022-01-12 LAB — BASIC METABOLIC PANEL
Anion gap: 13 (ref 5–15)
BUN: 7 mg/dL (ref 6–20)
CO2: 21 mmol/L — ABNORMAL LOW (ref 22–32)
Calcium: 8.7 mg/dL — ABNORMAL LOW (ref 8.9–10.3)
Chloride: 105 mmol/L (ref 98–111)
Creatinine, Ser: 1.12 mg/dL (ref 0.61–1.24)
GFR, Estimated: >60 mL/min (ref >60)
Glucose, Bld: 110 mg/dL — ABNORMAL HIGH (ref 70–99)
Potassium: 3.8 mmol/L (ref 3.5–5.1)
Sodium: 139 mmol/L (ref 135–145)

## 2022-01-12 LAB — HIV ANTIBODY (ROUTINE TESTING W REFLEX): HIV Screen 4th Generation wRfx: NONREACTIVE

## 2022-01-12 MED ORDER — DIPHENHYDRAMINE HCL 50 MG/ML IJ SOLN
12.5000 mg | Freq: Four times a day (QID) | INTRAMUSCULAR | Status: DC | PRN
  Administered 2022-01-14: 12.5 mg via INTRAVENOUS
  Filled 2022-01-12: qty 1

## 2022-01-12 MED ORDER — FLUCONAZOLE IN SODIUM CHLORIDE 200-0.9 MG/100ML-% IV SOLN
200.0000 mg | Freq: Every day | INTRAVENOUS | Status: DC
  Administered 2022-01-12 – 2022-01-15 (×5): 200 mg via INTRAVENOUS
  Filled 2022-01-12 (×6): qty 100

## 2022-01-12 MED ORDER — PIPERACILLIN-TAZOBACTAM 3.375 G IVPB
3.3750 g | Freq: Three times a day (TID) | INTRAVENOUS | Status: DC
  Administered 2022-01-12 – 2022-01-16 (×14): 3.375 g via INTRAVENOUS
  Filled 2022-01-12 (×14): qty 50

## 2022-01-12 MED ORDER — NALOXONE HCL 0.4 MG/ML IJ SOLN: 0.4000 mg | INTRAMUSCULAR | Status: DC | PRN

## 2022-01-12 MED ORDER — PANTOPRAZOLE SODIUM 40 MG IV SOLR
40.0000 mg | Freq: Every day | INTRAVENOUS | Status: DC
  Administered 2022-01-12 – 2022-01-15 (×4): 40 mg via INTRAVENOUS
  Filled 2022-01-12 (×5): qty 10

## 2022-01-12 MED ORDER — METHOCARBAMOL 1000 MG/10ML IJ SOLN
500.0000 mg | Freq: Three times a day (TID) | INTRAVENOUS | Status: DC
  Administered 2022-01-12 – 2022-01-15 (×9): 500 mg via INTRAVENOUS
  Filled 2022-01-12: qty 5
  Filled 2022-01-12 (×4): qty 500
  Filled 2022-01-12: qty 5
  Filled 2022-01-12: qty 500
  Filled 2022-01-12 (×2): qty 5
  Filled 2022-01-12 (×2): qty 500

## 2022-01-12 MED ORDER — DIPHENHYDRAMINE HCL 12.5 MG/5ML PO ELIX: 12.5000 mg | ORAL_SOLUTION | Freq: Four times a day (QID) | ORAL | Status: DC | PRN

## 2022-01-12 MED ORDER — ACETAMINOPHEN 10 MG/ML IV SOLN
1000.0000 mg | Freq: Four times a day (QID) | INTRAVENOUS | Status: AC
  Administered 2022-01-12 – 2022-01-13 (×4): 1000 mg via INTRAVENOUS
  Filled 2022-01-12 (×4): qty 100

## 2022-01-12 MED ORDER — PANTOPRAZOLE SODIUM 40 MG PO TBEC
40.0000 mg | DELAYED_RELEASE_TABLET | Freq: Every day | ORAL | Status: DC
  Administered 2022-01-16 – 2022-01-17 (×2): 40 mg via ORAL
  Filled 2022-01-12 (×2): qty 1

## 2022-01-12 MED ORDER — SODIUM CHLORIDE 0.9% FLUSH: 9.0000 mL | INTRAVENOUS | Status: DC | PRN

## 2022-01-12 MED ORDER — HYDROMORPHONE 1 MG/ML IV SOLN
INTRAVENOUS | Status: DC
  Administered 2022-01-12: 2 mL via INTRAVENOUS
  Administered 2022-01-13: 7 mL via INTRAVENOUS
  Administered 2022-01-13: 2.7 mg via INTRAVENOUS
  Administered 2022-01-13: 6 mL via INTRAVENOUS
  Administered 2022-01-13: 2.7 mg via INTRAVENOUS
  Administered 2022-01-13: 3 mg via INTRAVENOUS
  Administered 2022-01-14: 0.6 mg via INTRAVENOUS
  Administered 2022-01-14: 1.8 mL via INTRAVENOUS
  Filled 2022-01-12 (×3): qty 30

## 2022-01-12 MED ORDER — ONDANSETRON HCL 4 MG/2ML IJ SOLN: 4.0000 mg | Freq: Four times a day (QID) | INTRAMUSCULAR | Status: DC | PRN

## 2022-01-12 MED ORDER — DEXTROSE-NACL 5-0.9 % IV SOLN: INTRAVENOUS | Status: DC

## 2022-01-12 MED ORDER — ONDANSETRON 4 MG PO TBDP: 4.0000 mg | ORAL_TABLET | Freq: Four times a day (QID) | ORAL | Status: DC | PRN

## 2022-01-12 MED ORDER — ENOXAPARIN SODIUM 30 MG/0.3ML IJ SOSY
30.0000 mg | PREFILLED_SYRINGE | Freq: Two times a day (BID) | INTRAMUSCULAR | Status: DC
  Administered 2022-01-12 – 2022-01-16 (×9): 30 mg via SUBCUTANEOUS
  Filled 2022-01-12 (×11): qty 0.3
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
## 2022-01-13 ENCOUNTER — Encounter (HOSPITAL_COMMUNITY): Payer: Self-pay | Admitting: General Surgery

## 2022-01-13 NOTE — Progress Notes (Signed)
Mobility Specialist Progress Note:   01/13/22 1257  Mobility  Activity Ambulated with assistance in hallway  Activity Response Tolerated well  Distance Ambulated (ft) 2000 ft  $Mobility charge 1 Mobility  Level of Assistance Independent after set-up  Assistive Device None   Pt received in bed willing to participate. Complaints of stomach pain. Left in bed with call bell in reach and all needs met.   [facility] Surveyor, mining Chat only
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
## 2022-01-13 NOTE — Anesthesia Postprocedure Evaluation (Signed)
Anesthesia Post Note  Patient: David Bradford  Procedure(s) Performed: LAPAROSCOPY DIAGNOSTIC, POSSIBLE EXPLOR LAP (Abdomen) CONVERTED TO OPEN EXPLORATORY LAPAROTOMY (Abdomen) GASTRORRHAPHY (Abdomen)     Patient location during evaluation: PACU Anesthesia Type: General Level of consciousness: awake and alert Pain management: pain level controlled Vital Signs Assessment: post-procedure vital signs reviewed and stable Respiratory status: spontaneous breathing, nonlabored ventilation, respiratory function stable and patient connected to nasal cannula oxygen Cardiovascular status: blood pressure returned to baseline and stable Postop Assessment: no apparent nausea or vomiting Anesthetic complications: no   No notable events documented.  Last Vitals:  Vitals:   01/13/22 0742 01/13/22 0747  BP:  126/64  Pulse:  67  Resp: 20 17  Temp:  36.8 C  SpO2: 100% 100%    Last Pain:  Vitals:   01/13/22 0747  TempSrc: Oral  PainSc: [place]

## 2022-01-14 LAB — COMPREHENSIVE METABOLIC PANEL
ALT: 14 U/L (ref 0–44)
AST: 27 U/L (ref 15–41)
Albumin: 3.8 g/dL (ref 3.5–5.0)
Alkaline Phosphatase: 40 U/L (ref 38–126)
Anion gap: 8 (ref 5–15)
BUN: 8 mg/dL (ref 6–20)
CO2: 25 mmol/L (ref 22–32)
Calcium: 8.9 mg/dL (ref 8.9–10.3)
Chloride: 106 mmol/L (ref 98–111)
Creatinine, Ser: 1.14 mg/dL (ref 0.61–1.24)
GFR, Estimated: >60 mL/min (ref >60)
Glucose, Bld: 112 mg/dL — ABNORMAL HIGH (ref 70–99)
Potassium: 3.4 mmol/L — ABNORMAL LOW (ref 3.5–5.1)
Sodium: 139 mmol/L (ref 135–145)
Total Bilirubin: 0.7 mg/dL (ref 0.3–1.2)
Total Protein: 6.7 g/dL (ref 6.5–8.1)

## 2022-01-14 LAB — CBC
HCT: 40.5 % (ref 39.0–52.0)
Hemoglobin: 12.9 g/dL — ABNORMAL LOW (ref 13.0–17.0)
MCH: 30.4 pg (ref 26.0–34.0)
MCHC: 31.9 g/dL (ref 30.0–36.0)
MCV: 95.3 fL (ref 80.0–100.0)
Platelets: 240 10*3/uL (ref 150–400)
RBC: 4.25 MIL/uL (ref 4.22–5.81)
RDW: 12.4 % (ref 11.5–15.5)
WBC: 4.6 10*3/uL (ref 4.0–10.5)
nRBC: 0 % (ref 0.0–0.2)

## 2022-01-14 LAB — SURGICAL PATHOLOGY

## 2022-01-14 LAB — MAGNESIUM: Magnesium: 1.9 mg/dL (ref 1.7–2.4)

## 2022-01-14 MED ORDER — POTASSIUM CHLORIDE 10 MEQ/100ML IV SOLN: 10.0000 meq | INTRAVENOUS | Status: DC

## 2022-01-14 MED ORDER — POTASSIUM CHLORIDE 10 MEQ/100ML IV SOLN
10.0000 meq | INTRAVENOUS | Status: AC
  Administered 2022-01-14 (×6): 10 meq via INTRAVENOUS
  Filled 2022-01-14: qty 100

## 2022-01-14 MED ORDER — HYDROMORPHONE HCL 1 MG/ML IJ SOLN
0.5000 mg | INTRAMUSCULAR | Status: DC | PRN
  Administered 2022-01-14 – 2022-01-16 (×13): 1 mg via INTRAVENOUS
  Filled 2022-01-14 (×14): qty 1
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
## 2022-01-14 NOTE — Progress Notes (Signed)
Patient has refused lab blood sampling citing he as given blood 3 times already. Educated the patient on the need for blood work, he is agreeable to them obtaining labs in the morning.

## 2022-01-15 ENCOUNTER — Other Ambulatory Visit: Payer: Self-pay

## 2022-01-15 ENCOUNTER — Encounter (HOSPITAL_COMMUNITY): Payer: Self-pay

## 2022-01-15 MED ORDER — METHOCARBAMOL 1000 MG/10ML IJ SOLN
1000.0000 mg | Freq: Four times a day (QID) | INTRAVENOUS | Status: DC
  Administered 2022-01-15 – 2022-01-17 (×8): 1000 mg via INTRAVENOUS
  Filled 2022-01-15: qty 10
  Filled 2022-01-15 (×2): qty 1000
  Filled 2022-01-15: qty 10
  Filled 2022-01-15: qty 1000
  Filled 2022-01-15: qty 10
  Filled 2022-01-15: qty 1000
  Filled 2022-01-15: qty 10
  Filled 2022-01-15: qty 1000
  Filled 2022-01-15: qty 10
  Filled 2022-01-15 (×2): qty 1000
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
## 2022-01-16 LAB — BASIC METABOLIC PANEL
Anion gap: 8 (ref 5–15)
BUN: <5 mg/dL — ABNORMAL LOW (ref 6–20)
CO2: 22 mmol/L (ref 22–32)
Calcium: 8.5 mg/dL — ABNORMAL LOW (ref 8.9–10.3)
Chloride: 105 mmol/L (ref 98–111)
Creatinine, Ser: 0.88 mg/dL (ref 0.61–1.24)
GFR, Estimated: >60 mL/min (ref >60)
Glucose, Bld: 95 mg/dL (ref 70–99)
Potassium: 3.8 mmol/L (ref 3.5–5.1)
Sodium: 135 mmol/L (ref 135–145)

## 2022-01-16 MED ORDER — OXYCODONE HCL 5 MG PO TABS
5.0000 mg | ORAL_TABLET | ORAL | Status: DC | PRN
  Administered 2022-01-16 – 2022-01-17 (×6): 10 mg via ORAL
  Filled 2022-01-16 (×7): qty 2

## 2022-01-16 MED ORDER — DOCUSATE SODIUM 100 MG PO CAPS
100.0000 mg | ORAL_CAPSULE | Freq: Two times a day (BID) | ORAL | Status: DC
  Administered 2022-01-16 – 2022-01-17 (×3): 100 mg via ORAL
  Filled 2022-01-16 (×2): qty 1

## 2022-01-16 MED ORDER — FLUCONAZOLE IN SODIUM CHLORIDE 200-0.9 MG/100ML-% IV SOLN
200.0000 mg | Freq: Every day | INTRAVENOUS | Status: AC
  Administered 2022-01-16: 200 mg via INTRAVENOUS
  Filled 2022-01-16: qty 100

## 2022-01-16 MED ORDER — ACETAMINOPHEN 500 MG PO TABS: 1000.0000 mg | ORAL_TABLET | Freq: Four times a day (QID) | ORAL | Status: DC | PRN

## 2022-01-16 MED ORDER — POLYETHYLENE GLYCOL 3350 17 G PO PACK: 17.0000 g | PACK | Freq: Every day | ORAL | Status: DC | PRN

## 2022-01-16 MED ORDER — PIPERACILLIN-TAZOBACTAM 3.375 G IVPB
3.3750 g | Freq: Three times a day (TID) | INTRAVENOUS | Status: AC
  Administered 2022-01-16: 3.375 g via INTRAVENOUS
  Filled 2022-01-16: qty 50
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
## 2022-01-17 ENCOUNTER — Encounter (HOSPITAL_COMMUNITY): Payer: Self-pay | Admitting: *Deleted

## 2022-01-17 ENCOUNTER — Other Ambulatory Visit (HOSPITAL_COMMUNITY): Payer: Self-pay

## 2022-01-17 DIAGNOSIS — Z9889 Other specified postprocedural states: Secondary | ICD-10-CM

## 2022-01-17 MED ORDER — OXYCODONE HCL 5 MG PO TABS
5.0000 mg | ORAL_TABLET | ORAL | 0 refills | Status: AC | PRN
  Filled 2022-01-17: qty 30, 5d supply, fill #0

## 2022-01-17 MED ORDER — POLYETHYLENE GLYCOL 3350 17 G PO PACK: 17.0000 g | PACK | Freq: Every day | ORAL | 0 refills | Status: DC | PRN

## 2022-01-17 MED ORDER — METHOCARBAMOL 500 MG PO TABS
1000.0000 mg | ORAL_TABLET | Freq: Four times a day (QID) | ORAL | 0 refills | Status: AC | PRN
  Filled 2022-01-17: qty 40, 5d supply, fill #0

## 2022-01-17 MED ORDER — DOCUSATE SODIUM 100 MG PO CAPS: 100.0000 mg | ORAL_CAPSULE | Freq: Two times a day (BID) | ORAL | Status: DC | PRN

## 2022-01-17 MED ORDER — METHOCARBAMOL 500 MG PO TABS: 1000.0000 mg | ORAL_TABLET | Freq: Four times a day (QID) | ORAL | Status: DC
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
## 2022-01-17 NOTE — Discharge Instructions (Signed)
[facility]      [facility], [place] [phone]  OPEN ABDOMINAL SURGERY: POST OP INSTRUCTIONS  Always review your discharge instruction sheet given to you by the facility where your surgery was performed.  IF YOU HAVE DISABILITY OR FAMILY LEAVE FORMS, YOU MUST BRING THEM TO THE OFFICE FOR PROCESSING.  PLEASE DO NOT GIVE THEM TO YOUR DOCTOR.  A prescription for pain medication may be given to you upon discharge.  Take your pain medication as prescribed, if needed.  If narcotic pain medicine is not needed, then you may take acetaminophen (Tylenol) or ibuprofen (Advil) as needed. Take your usually prescribed medications unless otherwise directed. If you need a refill on your pain medication, please contact your pharmacy. They will contact our office to request authorization.  Prescriptions will not be filled after 5pm or on week-ends. You should follow a light diet the first few days after arrival home, such as soup and crackers, pudding, etc.unless your doctor has advised otherwise. A high-fiber, low fat diet can be resumed as tolerated.   Be sure to include lots of fluids daily. Most patients will experience some swelling and bruising on the chest and neck area.  Ice packs will help.  Swelling and bruising can take several days to resolve Most patients will experience some swelling and bruising in the area of the incision. Ice pack will help. Swelling and bruising can take several days to resolve..  It is common to experience some constipation if taking pain medication after surgery.  Increasing fluid intake and taking a stool softener will usually help or prevent this problem from occurring.  A mild laxative (Milk of Magnesia or Miralax) should be taken according to package directions if there are no bowel movements after 48 hours.  You may have steri-strips (small skin tapes) in place directly over the incision.  These strips should be left on the skin for 7-10 days.  If your surgeon used skin  glue on the incision, you may shower in 24 hours.  The glue will flake off over the next 2-3 weeks.  Any sutures or staples will be removed at the office during your follow-up visit. You may find that a light gauze bandage over your incision may keep your staples from being rubbed or pulled. You may shower and replace the bandage daily. ACTIVITIES:  You may resume regular (light) daily activities beginning the next day--such as daily self-care, walking, climbing stairs--gradually increasing activities as tolerated.  You may have sexual intercourse when it is comfortable.  Refrain from any heavy lifting or straining until approved by your doctor. Nothing greater than 10 pounds for 6 weeks from surgery You may drive when you no longer are taking prescription pain medication, you can comfortably wear a seatbelt, and you can safely maneuver your car and apply brakes Return to Work: ___________________________________ David Bradford should see your doctor in the office for a follow-up appointment approximately two weeks after your surgery.  Make sure that you call for this appointment within a day or two after you arrive home to insure a convenient appointment time. OTHER INSTRUCTIONS:  _____________________________________________________________ _____________________________________________________________  WHEN TO CALL YOUR DOCTOR: Fever over 101.0 Inability to urinate Nausea and/or vomiting Extreme swelling or bruising Continued bleeding from incision. Increased pain, redness, or drainage from the incision. Difficulty swallowing or breathing Muscle cramping or spasms. Numbness or tingling in hands or feet or around lips.  The clinic staff is available to answer your questions during regular business hours.  Please don't  hesitate to call and ask to speak to one of the nurses if you have concerns.  For further questions, please visit [email or web address]
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
## 2022-01-17 NOTE — Progress Notes (Signed)
SWOT nurse Tammy RN assisted with D/C of patient. Nurse went over DC instructions and MEDS, SWOT nurse transported patient and family to DC lounge to wait for medications from [facility].

## 2022-01-27 ENCOUNTER — Inpatient Hospital Stay: Payer: Self-pay | Admitting: Family Medicine

## 2022-03-29 ENCOUNTER — Encounter (HOSPITAL_COMMUNITY): Payer: Self-pay

## 2022-03-29 ENCOUNTER — Ambulatory Visit (HOSPITAL_COMMUNITY)
Admission: EM | Admit: 2022-03-29 | Discharge: 2022-03-29 | Disposition: A | Discharge status: Home / Self Care | Payer: Medicaid Other | Attending: Emergency Medicine | Admitting: Emergency Medicine

## 2022-03-29 DIAGNOSIS — R369 Urethral discharge, unspecified: Secondary | ICD-10-CM | POA: Insufficient documentation

## 2022-03-29 DIAGNOSIS — Z202 Contact with and (suspected) exposure to infections with a predominantly sexual mode of transmission: Secondary | ICD-10-CM | POA: Insufficient documentation

## 2022-03-29 NOTE — Discharge Instructions (Addendum)
We will call you if anything on your swab returns positive and treat as needed. Please abstain from sexual intercourse until your results return.

## 2022-03-29 NOTE — ED Notes (Signed)
Patient being rude with provider, ripping up papers she attempted to provide to him for discharge. Patient refusing to leave without being treated with antibiotics. [facility] security called.

## 2022-03-29 NOTE — ED Triage Notes (Addendum)
Patient with c/o discharge from penis. States a male he had intercourse with said she had gonorrhea, chlamydia and trich. Patient also c/o burning with urination.

## 2022-03-29 NOTE — ED Notes (Signed)
[facility] security escorted patient out of urgent care.
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
## 2022-03-31 LAB — CYTOLOGY, (ORAL, ANAL, URETHRAL) ANCILLARY ONLY
Chlamydia: NEGATIVE
Comment: NEGATIVE
Comment: NEGATIVE
Comment: NORMAL
Neisseria Gonorrhea: POSITIVE — AB
Trichomonas: POSITIVE — AB

## 2022-04-01 ENCOUNTER — Telehealth (HOSPITAL_COMMUNITY): Payer: Self-pay | Admitting: Emergency Medicine

## 2022-04-01 MED ORDER — METRONIDAZOLE 500 MG PO TABS: 2000.0000 mg | ORAL_TABLET | Freq: Once | ORAL | 0 refills | Status: AC

## 2022-04-01 NOTE — Telephone Encounter (Signed)
PEr protocol, patient will need treatment with IM Rocephin 500mg  for positive Gonorrhea Will also need treatment with Flagyl.  Results seen on mychart Attempted to reach patient x 1, LVM Prescription sent to pharmacy on file [facility] notified

## 2022-04-02 ENCOUNTER — Encounter (HOSPITAL_COMMUNITY): Payer: Self-pay

## 2022-04-02 ENCOUNTER — Ambulatory Visit (HOSPITAL_COMMUNITY)
Admission: EM | Admit: 2022-04-02 | Discharge: 2022-04-02 | Disposition: A | Discharge status: Home / Self Care | Payer: Medicaid Other | Attending: Internal Medicine | Admitting: Internal Medicine

## 2022-04-02 DIAGNOSIS — A549 Gonococcal infection, unspecified: Secondary | ICD-10-CM

## 2022-04-02 MED ORDER — CEFTRIAXONE SODIUM 500 MG IJ SOLR
INTRAMUSCULAR | Status: AC
  Filled 2022-04-02: qty 500

## 2022-04-02 MED ORDER — LIDOCAINE HCL (PF) 1 % IJ SOLN
INTRAMUSCULAR | Status: AC
  Filled 2022-04-02: qty 2

## 2022-04-02 MED ORDER — CEFTRIAXONE SODIUM 500 MG IJ SOLR
500.0000 mg | Freq: Once | INTRAMUSCULAR | Status: AC
  Administered 2022-04-02: 500 mg via INTRAMUSCULAR

## 2022-04-02 NOTE — ED Triage Notes (Signed)
Pt is here for treatment for gonorrhea  

## 2022-04-02 NOTE — ED Notes (Signed)
Pt received 500 mg of Ceftriaxone for gonorrheae in the left Ventrogluteal and tolerated well
